# Patient Record
Sex: Male | Born: 1995 | Race: White | Hispanic: No | Marital: Single | Smoking: Never smoker
Health system: Southern US, Community
[De-identification: ages and names within clinical notes are randomized; demographics above are authoritative.]

---

## 2017-10-28 ENCOUNTER — Encounter (HOSPITAL_COMMUNITY): Payer: Self-pay

## 2017-10-28 ENCOUNTER — Emergency Department (HOSPITAL_COMMUNITY): Payer: PRIVATE HEALTH INSURANCE

## 2017-10-28 ENCOUNTER — Emergency Department (HOSPITAL_COMMUNITY)
Admission: EM | Admit: 2017-10-28 | Discharge: 2017-10-28 | Disposition: A | Discharge status: Home / Self Care | Payer: PRIVATE HEALTH INSURANCE | Attending: Emergency Medicine | Admitting: Emergency Medicine

## 2017-10-28 DIAGNOSIS — S63284A Dislocation of proximal interphalangeal joint of right ring finger, initial encounter: Secondary | ICD-10-CM | POA: Insufficient documentation

## 2017-10-28 DIAGNOSIS — Y92320 Baseball field as the place of occurrence of the external cause: Secondary | ICD-10-CM | POA: Insufficient documentation

## 2017-10-28 DIAGNOSIS — Y998 Other external cause status: Secondary | ICD-10-CM | POA: Diagnosis not present

## 2017-10-28 DIAGNOSIS — Y9364 Activity, baseball: Secondary | ICD-10-CM | POA: Diagnosis not present

## 2017-10-28 DIAGNOSIS — X58XXXA Exposure to other specified factors, initial encounter: Secondary | ICD-10-CM | POA: Diagnosis not present

## 2017-10-28 DIAGNOSIS — S6991XA Unspecified injury of right wrist, hand and finger(s), initial encounter: Secondary | ICD-10-CM | POA: Diagnosis present

## 2017-10-28 DIAGNOSIS — S63259A Unspecified dislocation of unspecified finger, initial encounter: Secondary | ICD-10-CM

## 2017-10-28 MED ORDER — LIDOCAINE HCL (PF) 1 % IJ SOLN
10.0000 mL | Freq: Once | INTRAMUSCULAR | Status: AC
  Administered 2017-10-28: 10 mL
  Filled 2017-10-28: qty 10

## 2017-10-28 NOTE — Discharge Instructions (Signed)
Keep splint on except when bathing. You can alternate with ibuprofen and Tylenol as needed for pain. Use ice 3-4 dailt alternating 20 min, 20 min off.  Please follow-up with a hand doctor on return to [place].  Please return to emergency department if develop any new or worsening symptoms.

## 2017-10-28 NOTE — ED Triage Notes (Signed)
PT slid into second base and injured right 4th finger. Possible dislocation.

## 2017-10-28 NOTE — ED Notes (Signed)
Patient transported to X-ray 

## 2017-10-28 NOTE — ED Notes (Signed)
ED Provider at bedside. 

## 2017-10-28 NOTE — ED Provider Notes (Signed)
[facility] Provider Note   CSN: [id number] Arrival date & time: 10/28/17  1812     History   Chief Complaint Chief Complaint  Patient presents with  . Finger Injury    HPI Bethann BerkshireBrandon Huwe is a 22 y.o. male who is previously healthy who presents with right fourth finger pain after sliding into a base.  He believes he may have dislocated his finger.  He has been unable to move his finger without pain since the incident.  He initially had some numbness, however this is resolved now.  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  Patient denies any other injuries. He is right handed.  HPI  History reviewed. No pertinent past medical history.  There are no active problems to display for this patient.      Home Medications    Prior to Admission medications   Not on File    Family History No family history on file.  Social History Social History   Tobacco Use  . Smoking status: Never Smoker  . Smokeless tobacco: Never Used  Substance Use Topics  . Alcohol use: Never    Frequency: Never  . Drug use: Never     Allergies   Patient has no known allergies.   Review of Systems Review of Systems  Musculoskeletal: Positive for arthralgias.  Neurological: Positive for numbness (resolved).     Physical Exam Updated Vital Signs BP 130/85 (BP Location: Right Arm)   Pulse 70   Temp 98.4 F (36.9 C) (Oral)   Resp 14   SpO2 99%   Physical Exam  Constitutional: He appears well-developed and well-nourished. No distress.  HENT:  Head: Normocephalic and atraumatic.  Mouth/Throat: Oropharynx is clear and moist. No oropharyngeal exudate.  Eyes: Pupils are equal, round, and reactive to light. Conjunctivae are normal. Right eye exhibits no discharge. Left eye exhibits no discharge. No scleral icterus.  Neck: Normal range of motion. Neck supple. No thyromegaly present.    Cardiovascular: Normal rate, regular rhythm, normal heart sounds and intact distal pulses. Exam reveals no gallop and no friction rub.  No murmur heard. Pulmonary/Chest: Effort normal and breath sounds normal. No stridor. No respiratory distress. He has no wheezes. He has no rales.  Musculoskeletal: He exhibits no edema.  Deformity noted at the PIP of the right fourth digit, sensation intact, cap refill less than 2 seconds, patient hesitant to move the finger due to severe pain.  He is able to move the MCP without difficulty.  No tenderness on palpation of the finger or hand.  Lymphadenopathy:    He has no cervical adenopathy.  Neurological: He is alert. Coordination normal.  Skin: Skin is warm and dry. No rash noted. He is not diaphoretic. No pallor.  Psychiatric: He has a normal mood and affect.  Nursing note and vitals reviewed.    ED Treatments / Results  Labs (all labs ordered are listed, but only abnormal results are displayed) Labs Reviewed - No data to display  EKG None  Radiology Dg Finger Ring Right  Result Date: 10/28/2017 CLINICAL DATA:  Post reduction EXAM: RIGHT RING FINGER 2+V COMPARISON:  10/28/2017 FINDINGS: Reduction of PIP dislocation. No fracture. Slightly limited lateral view due to overlap of osseous structures. Possible old fifth metacarpal fracture. IMPRESSION: Reduction of fourth PIP joint dislocation. No definitive fracture seen. Electronically Signed   By: Jasmine PangKim  Fujinaga M.D.   On: 10/28/2017 21:18  Dg Finger Ring Right  Result Date: 10/28/2017 CLINICAL DATA:  Right ring finger pain after baseball injury. EXAM: RIGHT RING FINGER 2+V COMPARISON:  None. FINDINGS: There is dorsal dislocation of the right fourth middle phalanx relative to the head of the proximal phalanx. Associated soft tissue swelling is noted. No fracture is identified. IMPRESSION: Right fourth PIP joint dislocation without fracture. Electronically Signed   By: Tollie Eth M.D.   On:  10/28/2017 19:48    Procedures .Nerve Block Date/Time: 10/28/2017 9:29 PM Performed by: Emi Holes, PA-C Authorized by: Emi Holes, PA-C   Consent:    Consent obtained:  Verbal   Consent given by:  Patient   Risks discussed:  Bleeding, swelling and pain   Alternatives discussed:  No treatment Indications:    Indications:  Procedural anesthesia Location:    Body area:  Upper extremity   Upper extremity nerve blocked: digital.   Laterality:  Right Pre-procedure details:    Skin preparation:  2% chlorhexidine Skin anesthesia (see MAR for exact dosages):    Skin anesthesia method:  None Procedure details (see MAR for exact dosages):    Block needle gauge:  25 G   Anesthetic injected:  Lidocaine 1% w/o epi   Steroid injected:  None   Additive injected:  None   Injection procedure:  Anatomic landmarks identified, incremental injection, negative aspiration for blood, anatomic landmarks palpated and introduced needle   Paresthesia:  Immediately resolved Post-procedure details:    Dressing: Band-aid.   Outcome:  Anesthesia achieved   Patient tolerance of procedure:  Tolerated well, no immediate complications Reduction of dislocation Date/Time: 10/28/2017 9:31 PM Performed by: Emi Holes, PA-C Authorized by: Emi Holes, PA-C  Consent: Verbal consent obtained. Risks and benefits: risks, benefits and alternatives were discussed Consent given by: patient Patient identity confirmed: verbally with patient Local anesthesia used: yes Anesthesia: digital block  Anesthesia: Local anesthesia used: yes Local Anesthetic: lidocaine 1% without epinephrine Anesthetic total: 5 mL  Sedation: Patient sedated: no  Patient tolerance: Patient tolerated the procedure well with no immediate complications Comments: Successful reduction of the right fourth digit with traction of the digit    (including critical care time)  Medications Ordered in ED Medications   lidocaine (PF) (XYLOCAINE) 1 % injection 10 mL (10 mLs Infiltration Given by Other 10/28/17 2138)     Initial Impression / Assessment and Plan / ED Course  I have reviewed the triage vital signs and the nursing notes.  Pertinent labs & imaging results that were available during my care of the patient were reviewed by me and considered in my medical decision making (see chart for details).     Patient with finger dislocation reduced in the ED.  X-ray showed dislocation without fracture.  Postreduction films show reduced digit.  Patient placed in static finger splint.  Patient's symptoms resolved immediately.  He reports his finger feels back to normal.  Will have patient follow-up to hand on return to [place], this patient is here for a baseball tournament.  Patient advised to stay out of baseball until cleared by athletic training and orthopedics.  Patient understands and agrees with plan.  Return precautions discussed.  Patient discharged in satisfactory condition.  Final Clinical Impressions(s) / ED Diagnoses   Final diagnoses:  Dislocation of finger, initial encounter    ED Discharge Orders    None       Emi Holes, PA-C 10/28/17 2151    Margarita Grizzle, MD 10/31/17 1401

## 2019-12-11 IMAGING — CR DG FINGER RING 2+V*R*
3 series · 3 of 3 positions shown · non-contrast
Comparison: None.

CLINICAL DATA: Right ring finger pain after baseball injury.

EXAM:
RIGHT RING FINGER 2+V

[finger ap]
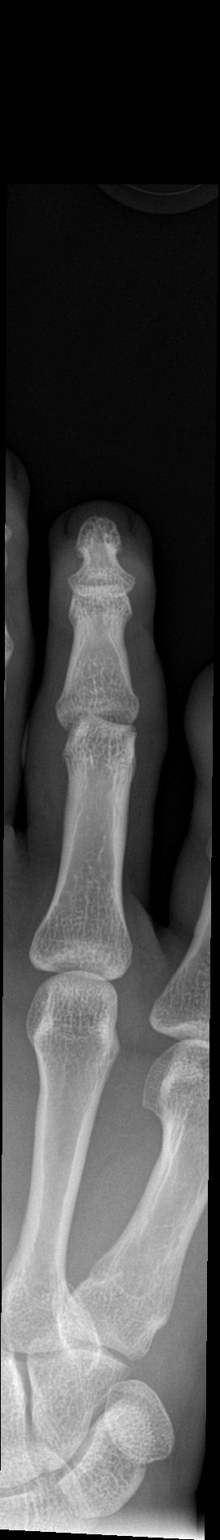

[finger lat]
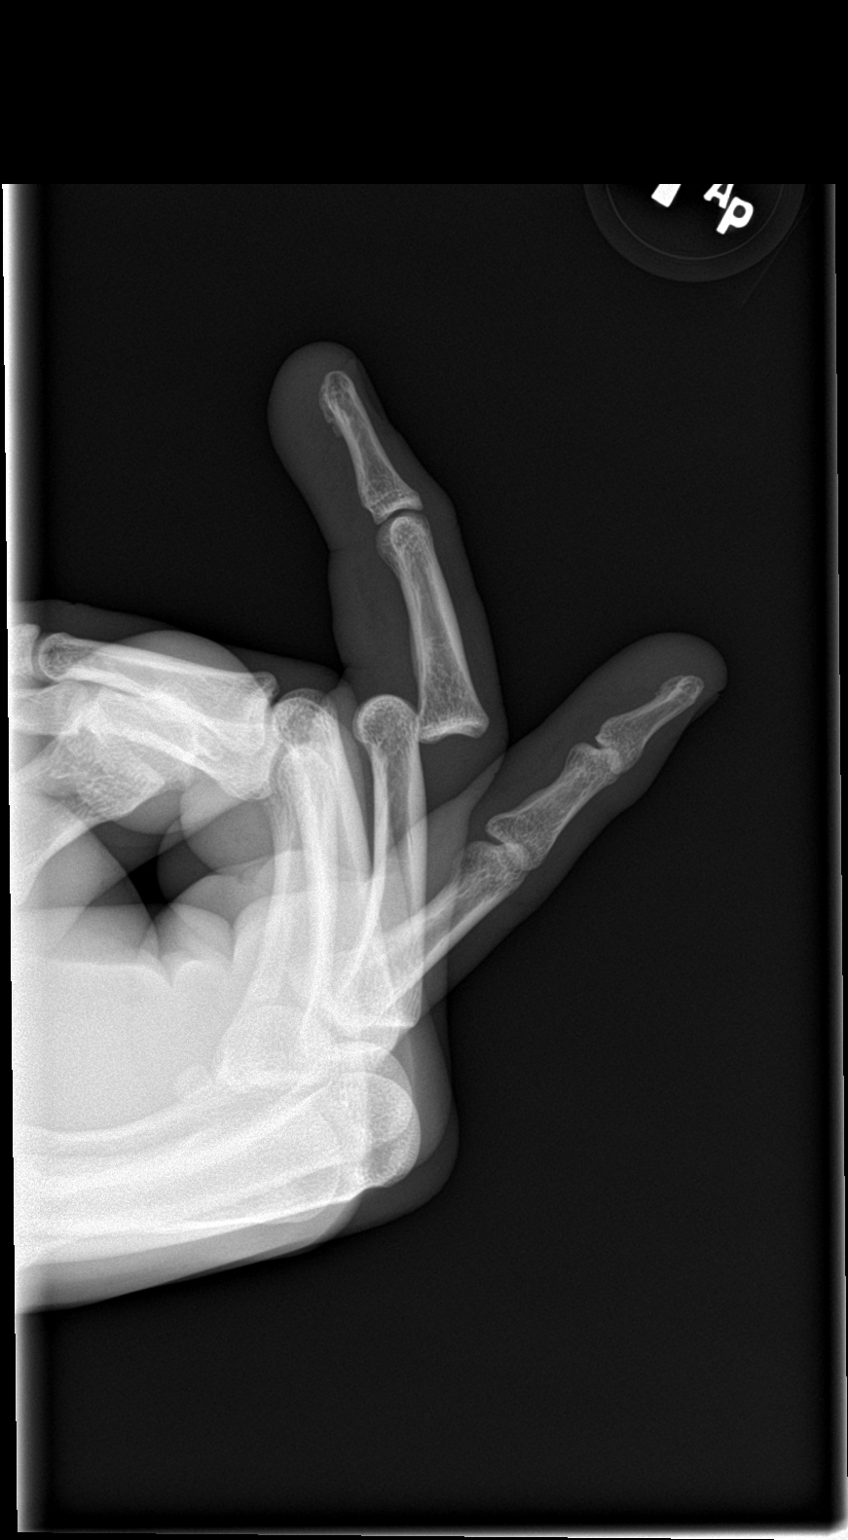

[finger obl]
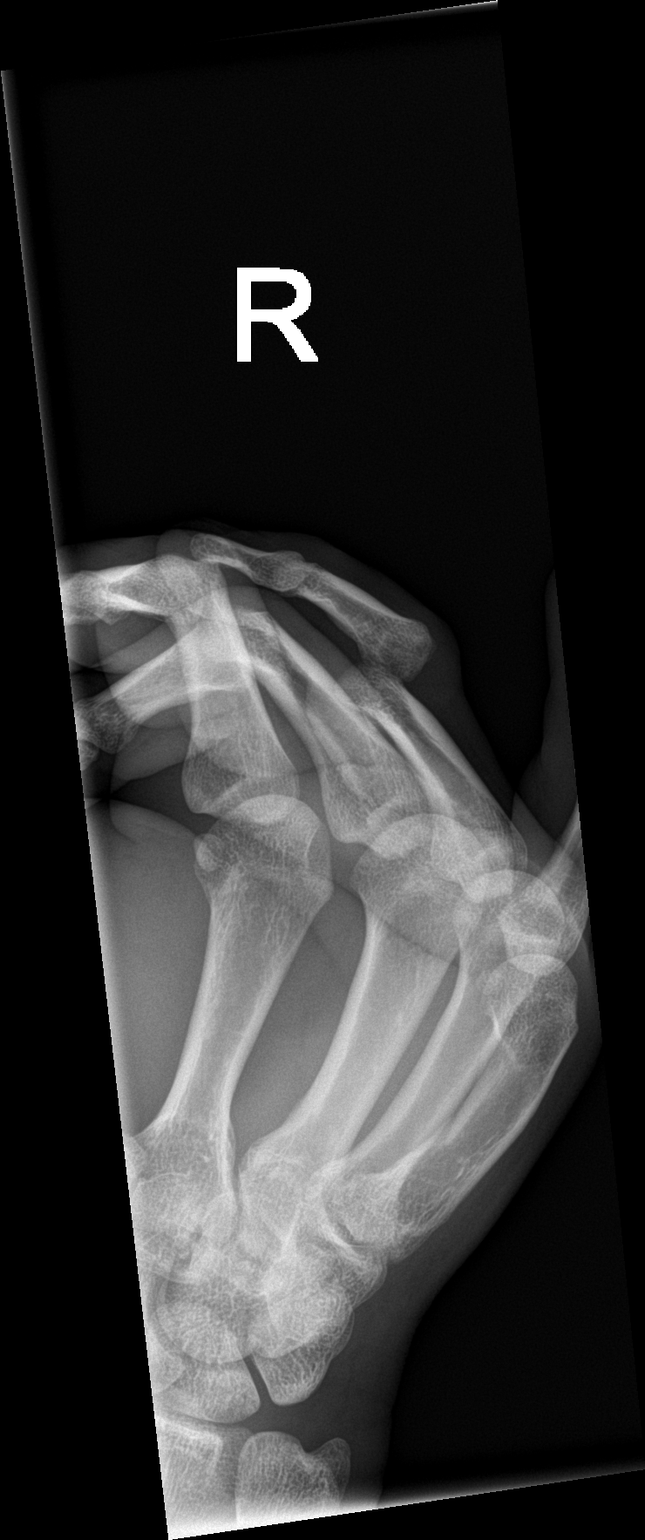

[3 of 3 positions shown; findings below may reference images not displayed]

FINDINGS: There is dorsal dislocation of the right fourth middle phalanx
relative to the head of the proximal phalanx. Associated soft tissue
swelling is noted. No fracture is identified.
IMPRESSION: Right fourth PIP joint dislocation without fracture.
